# Patient Record
Sex: Male | Born: 1980 | Hispanic: No | Marital: Married
Health system: Southern US, Community
[De-identification: ages and names within clinical notes are randomized; demographics above are authoritative.]

---

## 2018-07-18 DIAGNOSIS — M546 Pain in thoracic spine: Secondary | ICD-10-CM | POA: Insufficient documentation

## 2018-07-18 DIAGNOSIS — S80812A Abrasion, left lower leg, initial encounter: Secondary | ICD-10-CM | POA: Insufficient documentation

## 2018-07-18 DIAGNOSIS — R51 Headache: Secondary | ICD-10-CM | POA: Insufficient documentation

## 2018-07-18 DIAGNOSIS — Y92810 Car as the place of occurrence of the external cause: Secondary | ICD-10-CM | POA: Insufficient documentation

## 2018-07-18 DIAGNOSIS — Y939 Activity, unspecified: Secondary | ICD-10-CM | POA: Insufficient documentation

## 2018-07-18 DIAGNOSIS — Y999 Unspecified external cause status: Secondary | ICD-10-CM | POA: Insufficient documentation

## 2018-07-18 DIAGNOSIS — R1010 Upper abdominal pain, unspecified: Secondary | ICD-10-CM | POA: Insufficient documentation

## 2018-07-18 DIAGNOSIS — S60512A Abrasion of left hand, initial encounter: Secondary | ICD-10-CM | POA: Insufficient documentation

## 2018-07-18 DIAGNOSIS — S0083XA Contusion of other part of head, initial encounter: Secondary | ICD-10-CM | POA: Insufficient documentation
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
## 2018-07-19 ENCOUNTER — Emergency Department: Payer: Self-pay

## 2018-07-19 ENCOUNTER — Emergency Department
Admission: EM | Admit: 2018-07-19 | Discharge: 2018-07-19 | Disposition: A | Discharge status: Home / Self Care | Payer: Self-pay | Attending: Emergency Medicine | Admitting: Emergency Medicine

## 2018-07-19 ENCOUNTER — Other Ambulatory Visit: Payer: Self-pay

## 2018-07-19 DIAGNOSIS — T148XXA Other injury of unspecified body region, initial encounter: Secondary | ICD-10-CM

## 2018-07-19 DIAGNOSIS — R220 Localized swelling, mass and lump, head: Secondary | ICD-10-CM

## 2018-07-19 MED ORDER — MORPHINE SULFATE (PF) 4 MG/ML IV SOLN
8.0000 mg | Freq: Once | INTRAVENOUS | Status: AC
  Administered 2018-07-19: 8 mg via INTRAVENOUS
  Filled 2018-07-19: qty 2

## 2018-07-19 MED ORDER — ONDANSETRON HCL 4 MG/2ML IJ SOLN
4.0000 mg | Freq: Once | INTRAMUSCULAR | Status: AC
  Administered 2018-07-19: 4 mg via INTRAVENOUS
  Filled 2018-07-19: qty 2

## 2018-07-19 MED ORDER — KETOROLAC TROMETHAMINE 30 MG/ML IJ SOLN
15.0000 mg | Freq: Once | INTRAMUSCULAR | Status: AC
  Administered 2018-07-19: 15 mg via INTRAVENOUS
  Filled 2018-07-19: qty 1

## 2018-07-19 MED ORDER — HYDROCODONE-ACETAMINOPHEN 5-325 MG PO TABS: 1.0000 | ORAL_TABLET | Freq: Four times a day (QID) | ORAL | 0 refills | Status: AC | PRN

## 2018-07-19 MED ORDER — IBUPROFEN 600 MG PO TABS: 600.0000 mg | ORAL_TABLET | Freq: Three times a day (TID) | ORAL | 0 refills | Status: AC | PRN
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
